# Patient Record
Sex: Female | Born: 1965 | Race: Black or African American | Hispanic: No | Marital: Single | Smoking: Current every day smoker
Health system: Southern US, Community
[De-identification: ages and names within clinical notes are randomized; demographics above are authoritative.]

## PROBLEM LIST (undated history)

## (undated) DIAGNOSIS — I10 Essential (primary) hypertension: Secondary | ICD-10-CM

---

## 2004-01-31 ENCOUNTER — Inpatient Hospital Stay (HOSPITAL_COMMUNITY): Admission: AD | Admit: 2004-01-31 | Discharge: 2004-02-05 | Payer: Self-pay | Admitting: Obstetrics & Gynecology

## 2004-01-31 ENCOUNTER — Ambulatory Visit: Payer: Self-pay | Admitting: *Deleted

## 2004-01-31 ENCOUNTER — Ambulatory Visit: Payer: Self-pay | Admitting: Family Medicine

## 2006-02-15 IMAGING — US US OB COMP +14 WK
1 series · 14 of 28 positions shown · non-contrast
Comparison: none

CLINICAL DATA: Preterm labor.  Short cervix.

[Series 1: us ob comp +14 wk · 0.33mm/px · 94 acquisitions, 14 frames shown]
[im 4/94]
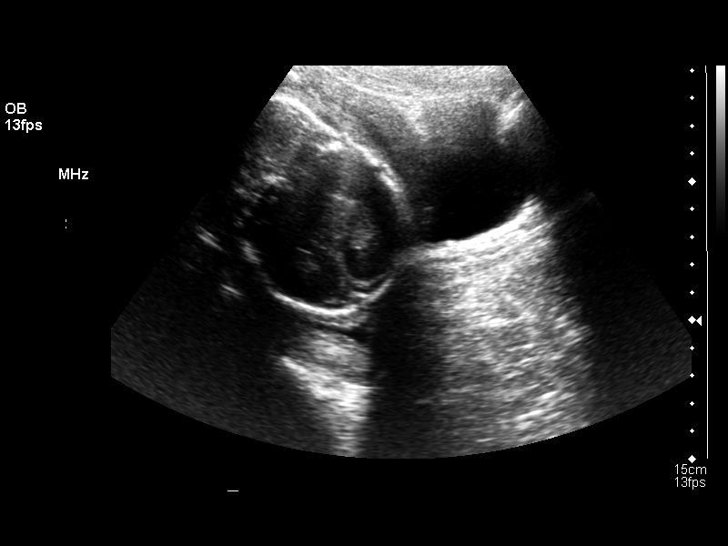
[im 11/94]
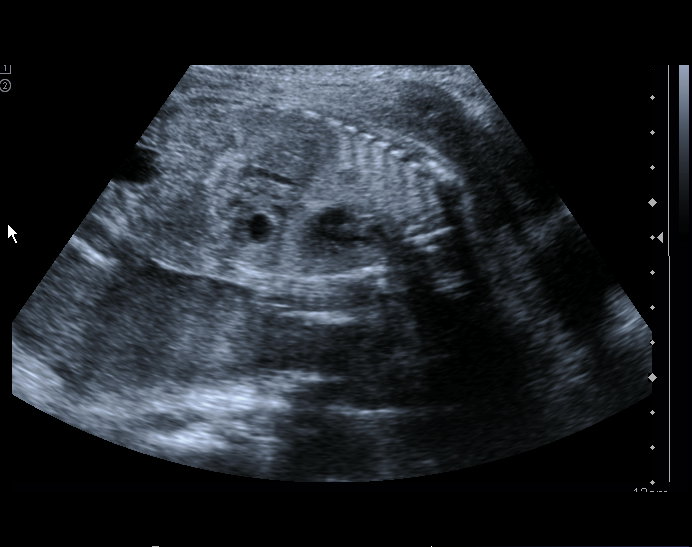
[im 18/94]
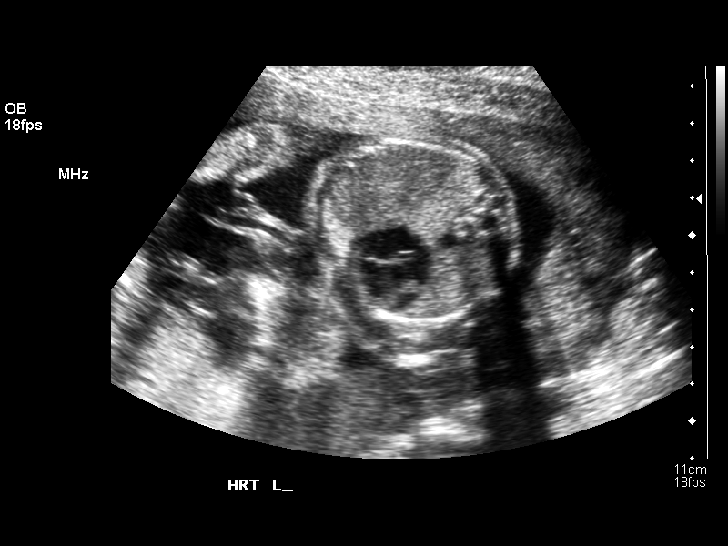
[im 25/94]
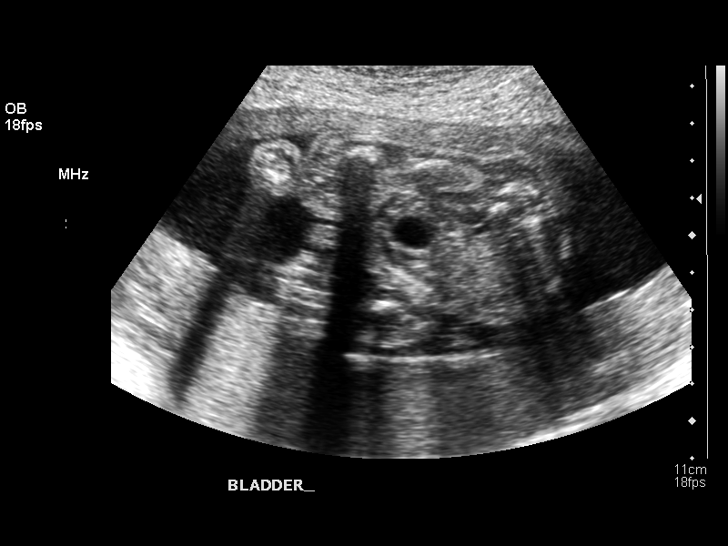
[im 32/94]
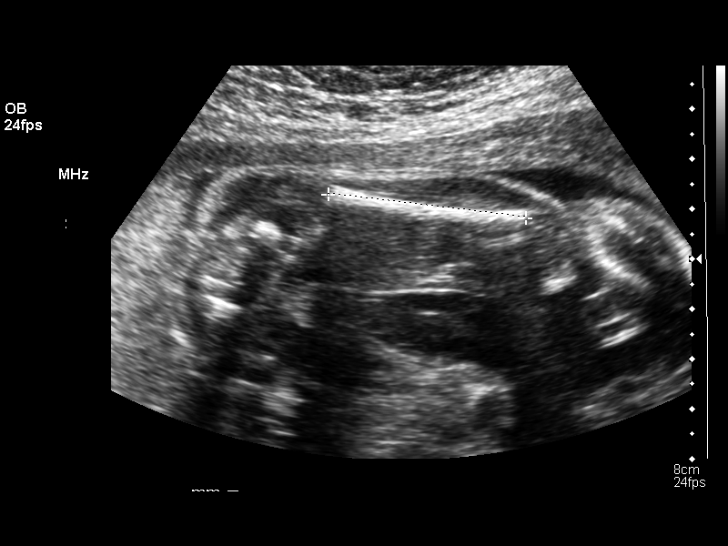
[im 38/94]
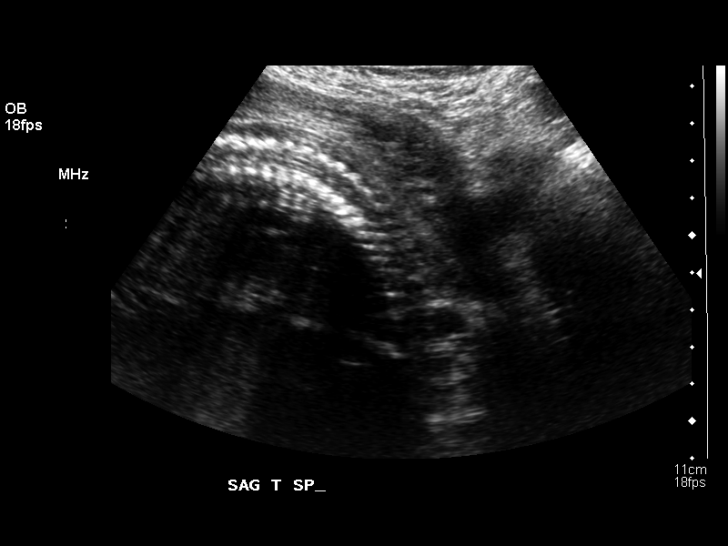
[im 45/94]
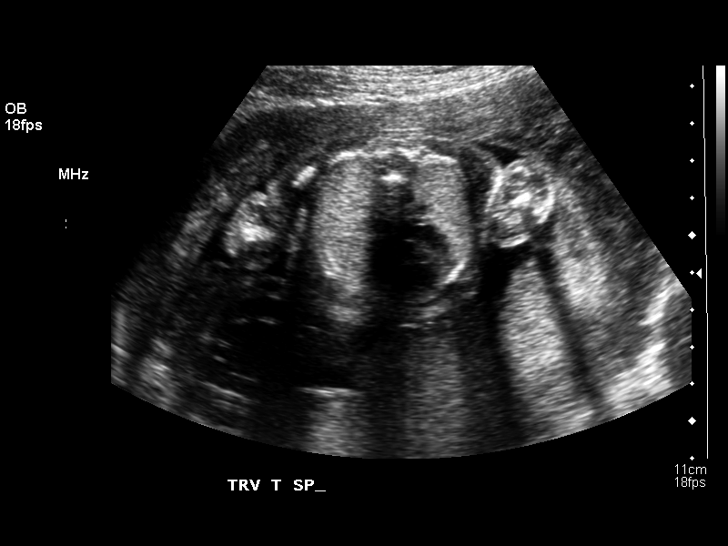
[im 52/94]
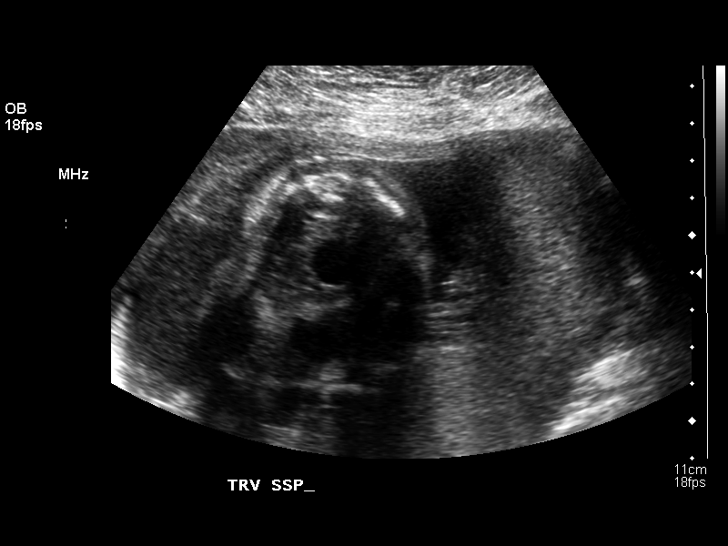
[im 59/94]
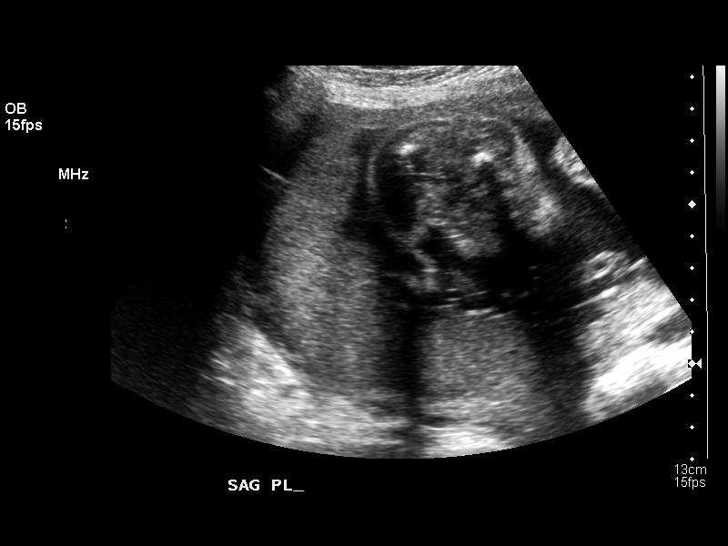
[im 66/94]
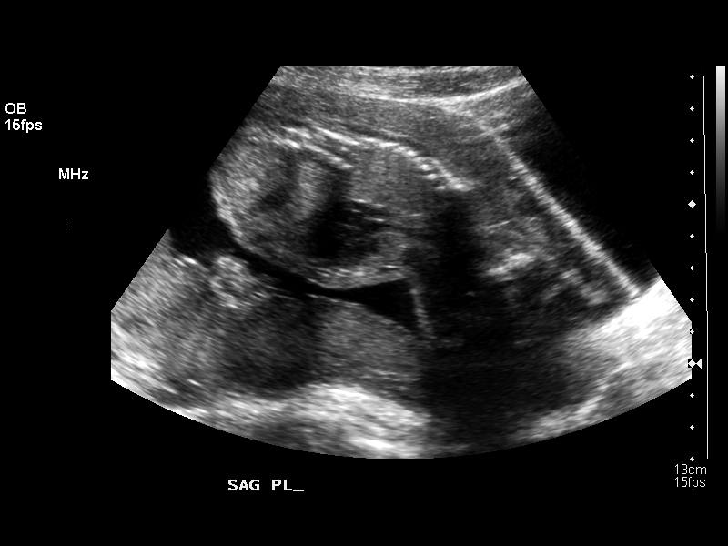
[im 73/94]
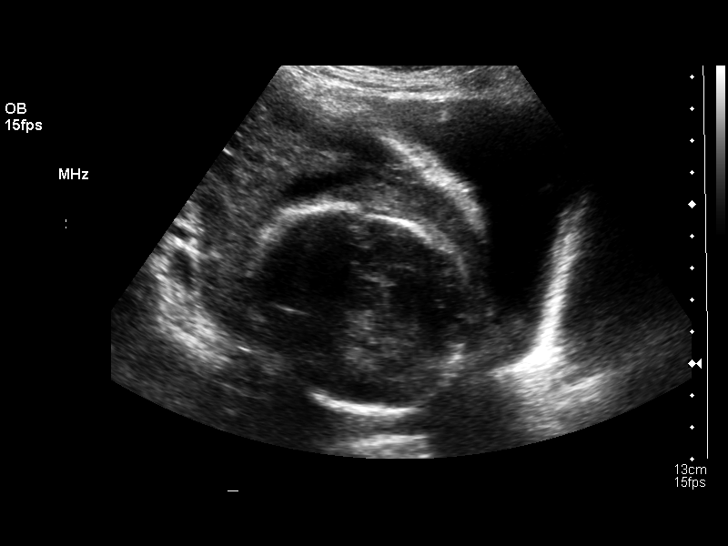
[im 80/94]
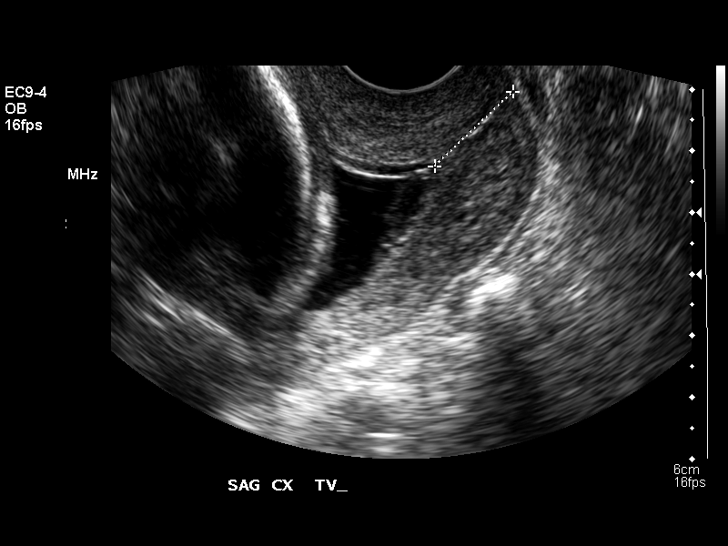
[im 87/94]
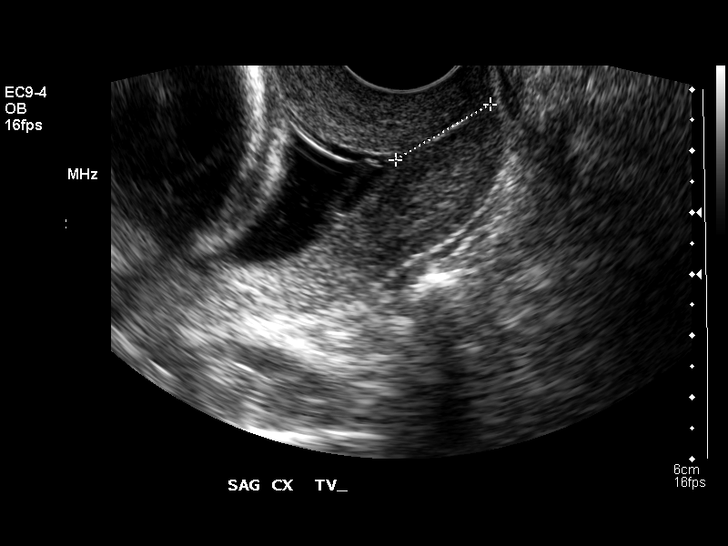
[im 94/94]
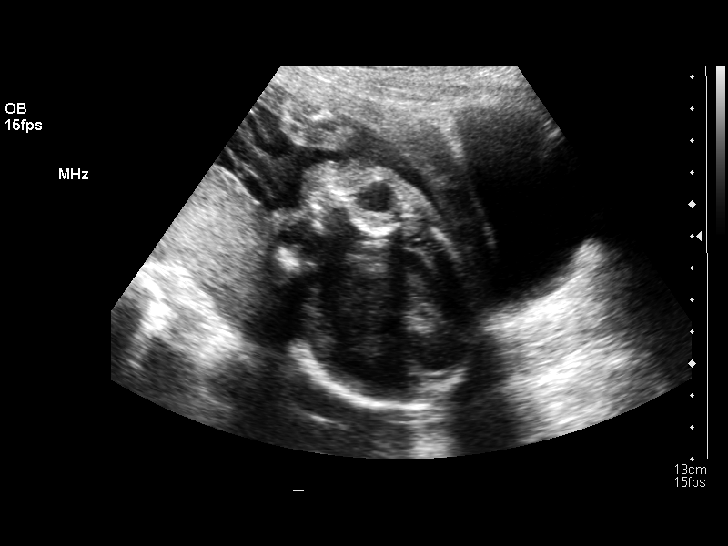

[14 of 28 positions shown; findings below may reference images not displayed]

OBSTETRICAL ULTRASOUND WITH TRANSVAGINAL:
 Number of Fetuses: 1
 Heart Rate:  139
 Movement:  Yes
 Breathing:  No  
 Presentation:  Cephalic
 Placental Location:  Posterior
 Grade:  I
 Previa:  No
 Amniotic Fluid (Subjective):  Normal
 Amniotic Fluid (Objective):   3.6 cm Vertical pocket 

 FETAL BIOMETRY
 BPD:   5.9 cm  24 w 2 d
 HC:   21.7 cm   23 w 3 d
 AC:   17.3 cm   22 w 2 d
 FL:   4.1 cm  23 w 1 d 

 MEAN GA:  23 w 3 d

 FETAL ANATOMY
 Lateral Ventricles:    Not visualized 
 Thalami/CSP:      Not visualized 
 Posterior Fossa:  Not visualized 
 Nuchal Region:    N/A
 Spine:      Limited
 4 Chamber Heart on Left:      Visualized 
 Stomach on Left:      Visualized 
 3 Vessel Cord:    Visualized 
 Cord Insertion site:    Not visualized 
 Kidneys:  Visualized 
 Bladder:  Visualized 
 Extremities:      Not visualized 

 ADDITIONAL ANATOMY VISUALIZED:  LVOT, orbits, diaphragm, aortic arch, and male genitalia.  

 Evaluation limited by:  Fetal position.

 MATERNAL UTERINE AND ADNEXAL FINDINGS
 Cervix:   1.8 cm Transvaginally
IMPRESSION: Single live intrauterine pregnancy with an estimated gestational age of 23 weeks 3 days.  Cervix of 1.8 cm.  

 </u12:p>

## 2022-03-28 ENCOUNTER — Emergency Department (HOSPITAL_COMMUNITY)
Admission: EM | Admit: 2022-03-28 | Discharge: 2022-03-28 | Disposition: A | Discharge status: Home / Self Care | Payer: No Typology Code available for payment source | Attending: Emergency Medicine | Admitting: Emergency Medicine

## 2022-03-28 ENCOUNTER — Encounter (HOSPITAL_COMMUNITY): Payer: Self-pay

## 2022-03-28 ENCOUNTER — Other Ambulatory Visit: Payer: Self-pay

## 2022-03-28 ENCOUNTER — Emergency Department (HOSPITAL_COMMUNITY): Payer: No Typology Code available for payment source

## 2022-03-28 DIAGNOSIS — Y92410 Unspecified street and highway as the place of occurrence of the external cause: Secondary | ICD-10-CM | POA: Diagnosis not present

## 2022-03-28 DIAGNOSIS — M25511 Pain in right shoulder: Secondary | ICD-10-CM

## 2022-03-28 DIAGNOSIS — M25561 Pain in right knee: Secondary | ICD-10-CM

## 2022-03-28 DIAGNOSIS — Z23 Encounter for immunization: Secondary | ICD-10-CM | POA: Insufficient documentation

## 2022-03-28 DIAGNOSIS — S80212A Abrasion, left knee, initial encounter: Secondary | ICD-10-CM | POA: Diagnosis not present

## 2022-03-28 DIAGNOSIS — T148XXA Other injury of unspecified body region, initial encounter: Secondary | ICD-10-CM

## 2022-03-28 DIAGNOSIS — S8992XA Unspecified injury of left lower leg, initial encounter: Secondary | ICD-10-CM | POA: Diagnosis present

## 2022-03-28 HISTORY — DX: Essential (primary) hypertension: I10

## 2022-03-28 MED ORDER — HYDROCODONE-ACETAMINOPHEN 5-325 MG PO TABS
1.0000 | ORAL_TABLET | Freq: Once | ORAL | Status: AC
  Administered 2022-03-28: 1 via ORAL
  Filled 2022-03-28: qty 1

## 2022-03-28 MED ORDER — CYCLOBENZAPRINE HCL 10 MG PO TABS
5.0000 mg | ORAL_TABLET | Freq: Once | ORAL | Status: AC
Start: 2022-03-28 — End: 2022-03-28
  Administered 2022-03-28: 5 mg via ORAL
  Filled 2022-03-28: qty 1

## 2022-03-28 MED ORDER — LIDOCAINE 5 % EX PTCH
1.0000 | MEDICATED_PATCH | CUTANEOUS | Status: DC
  Administered 2022-03-28: 1 via TRANSDERMAL
  Filled 2022-03-28: qty 1

## 2022-03-28 MED ORDER — TETANUS-DIPHTH-ACELL PERTUSSIS 5-2.5-18.5 LF-MCG/0.5 IM SUSY
0.5000 mL | PREFILLED_SYRINGE | Freq: Once | INTRAMUSCULAR | Status: AC
  Administered 2022-03-28: 0.5 mL via INTRAMUSCULAR
  Filled 2022-03-28: qty 0.5
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
## 2022-03-28 NOTE — ED Notes (Signed)
Patient did not want her vitals taking at discharge.

## 2022-03-28 NOTE — Discharge Instructions (Addendum)
Please follow-up with your primary care doctor, return to the ER if you have severe chest pain, shortness of breath, confusion, intractable nausea or vomiting.  Are unable to bear any weight.  You can take ibuprofen and Tylenol for your pain control.  As well as use over-the-counter lidocaine patches.
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
